# Patient Record
Sex: Male | Born: 2000 | Race: Black or African American | Hispanic: No | Marital: Single | Smoking: Never smoker
Health system: Southern US, Community
[De-identification: ages and names within clinical notes are randomized; demographics above are authoritative.]

## PROBLEM LIST (undated history)

## (undated) DIAGNOSIS — M719 Bursopathy, unspecified: Secondary | ICD-10-CM

---
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
## 2019-12-07 IMAGING — US US ABDOMEN LIMITED
1 series · 14 of 25 positions shown · non-contrast
Comparison: None.

CLINICAL DATA: Acute upper abdominal pain, nausea.

EXAM:
ULTRASOUND ABDOMEN LIMITED RIGHT UPPER QUADRANT

[Series 1: us abdomen limited · 0.24mm/px · 14 of 47 slices shown]
[im 1/47]
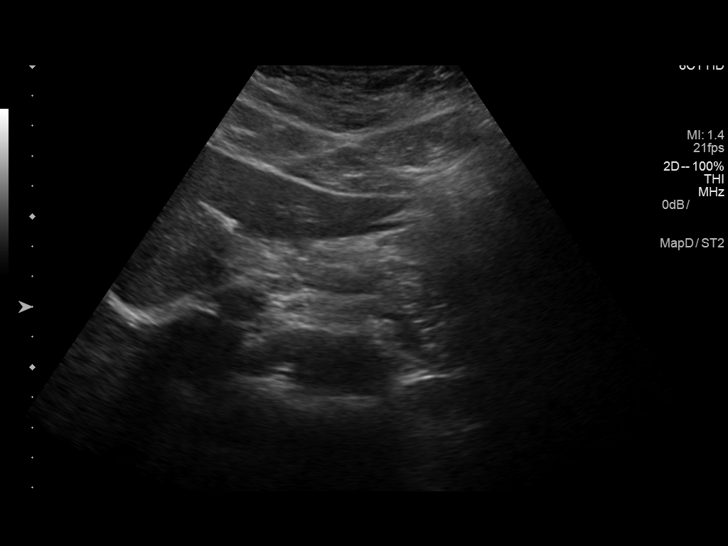
[im 4/47]
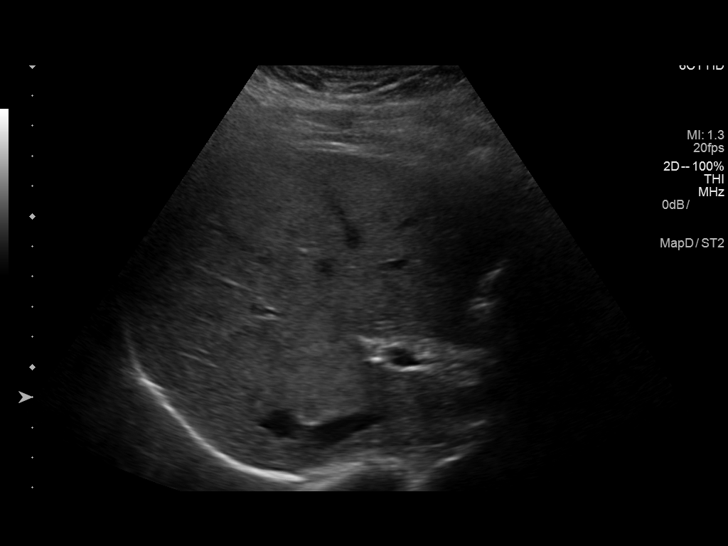
[im 8/47]
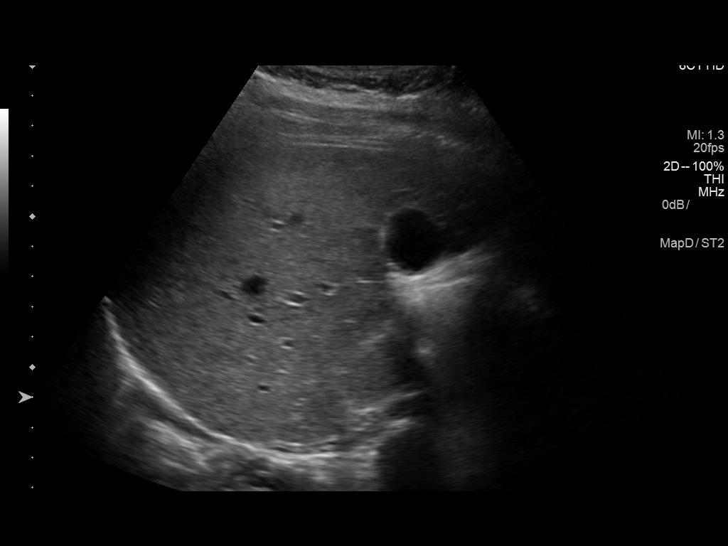
[im 12/47]
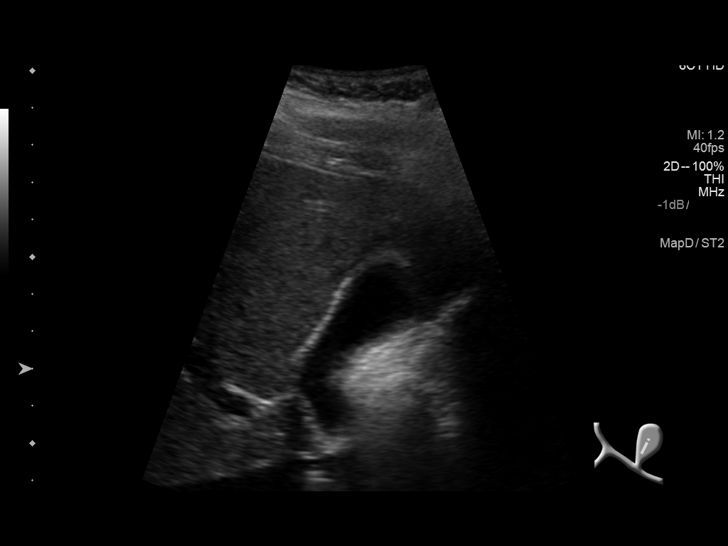
[im 16/47]
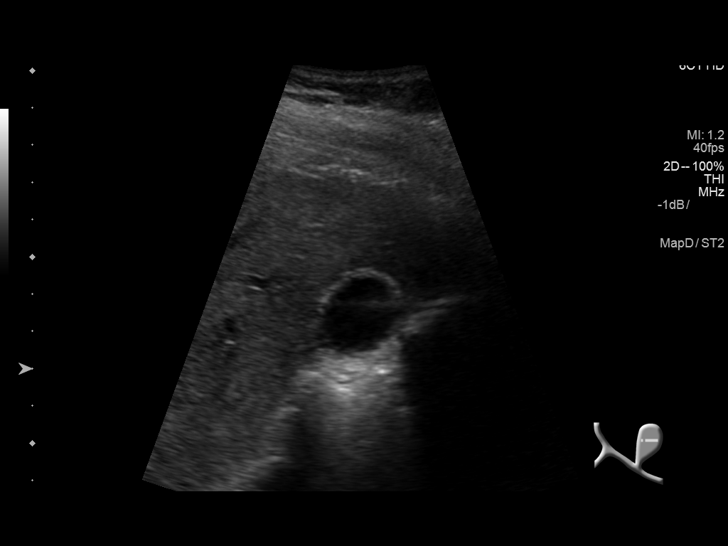
[im 18/47]
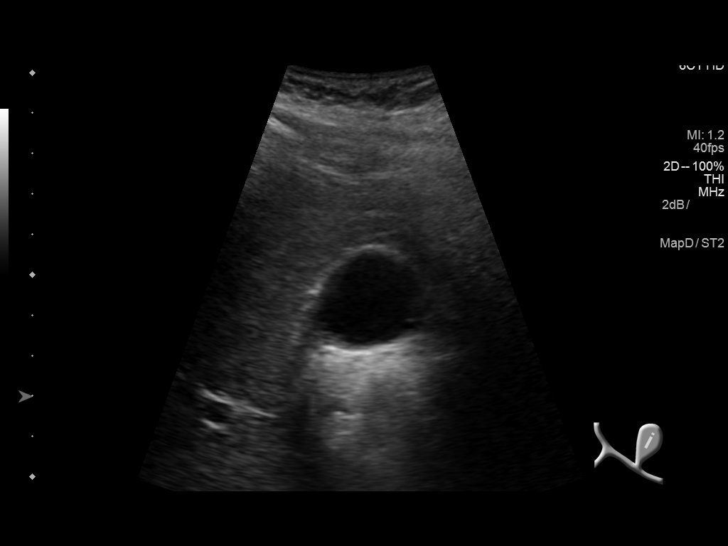
[im 22/47]
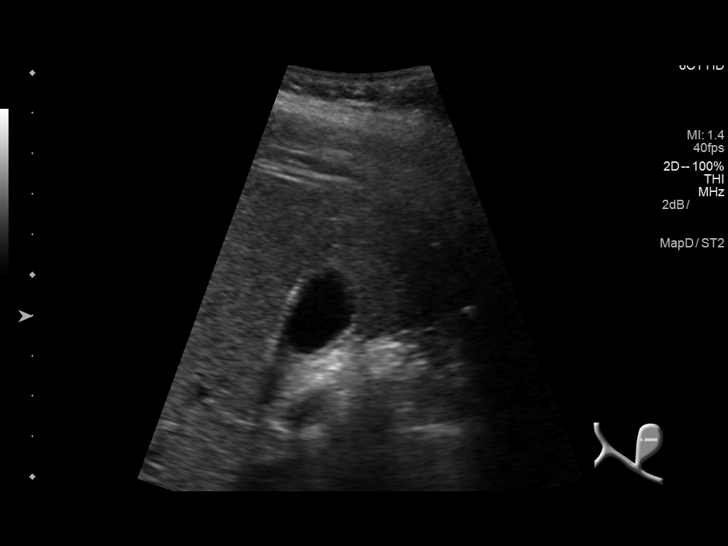
[im 25/47]
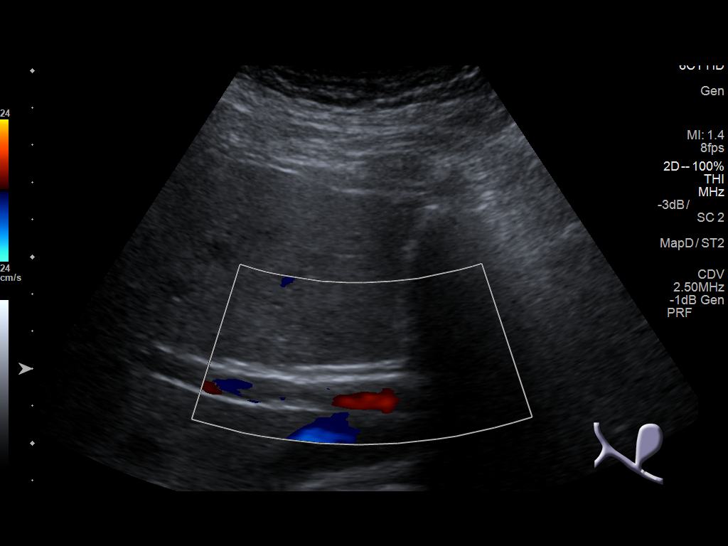
[im 29/47]
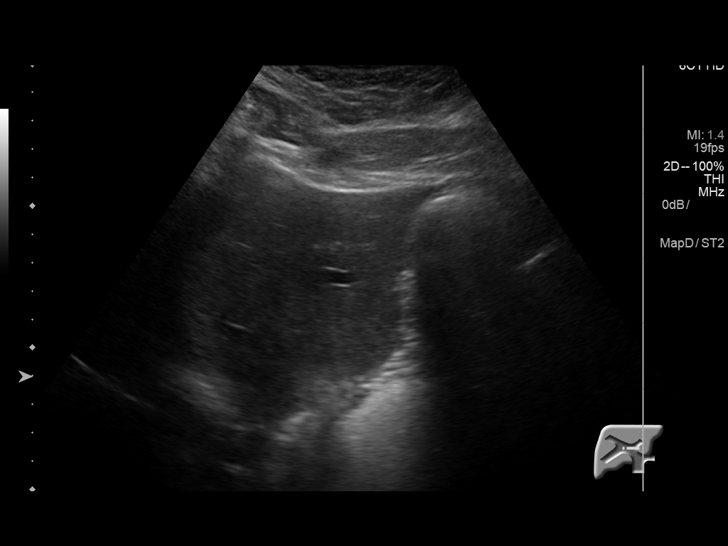
[im 31/47]
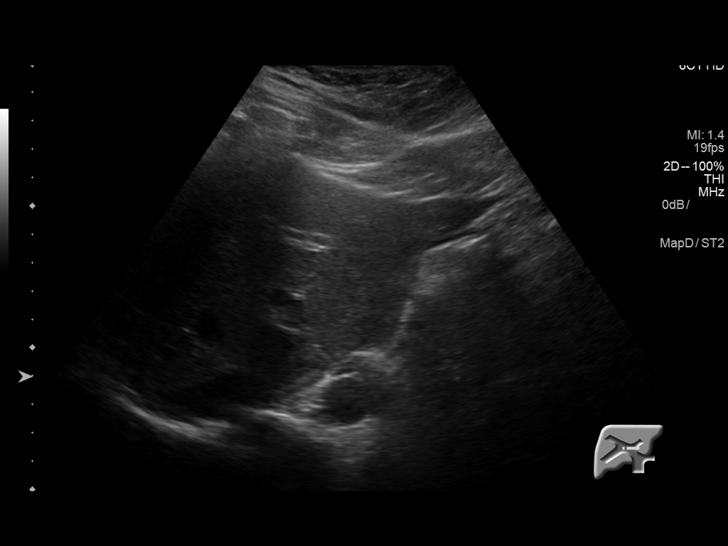
[im 35/47]
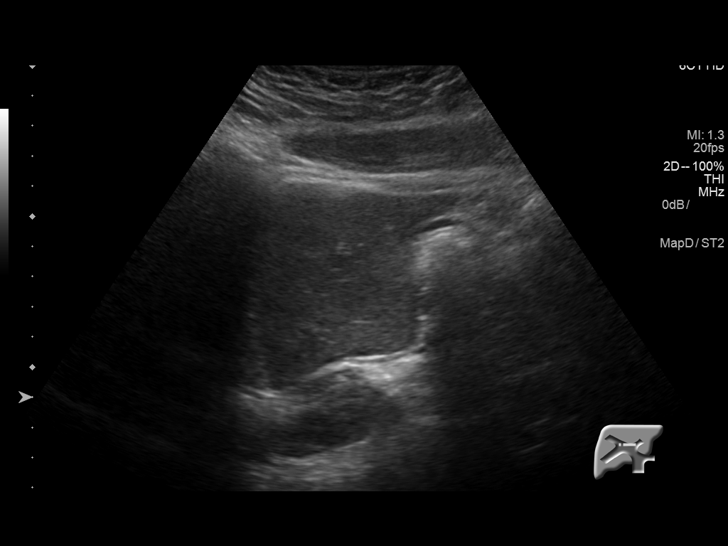
[im 39/47]
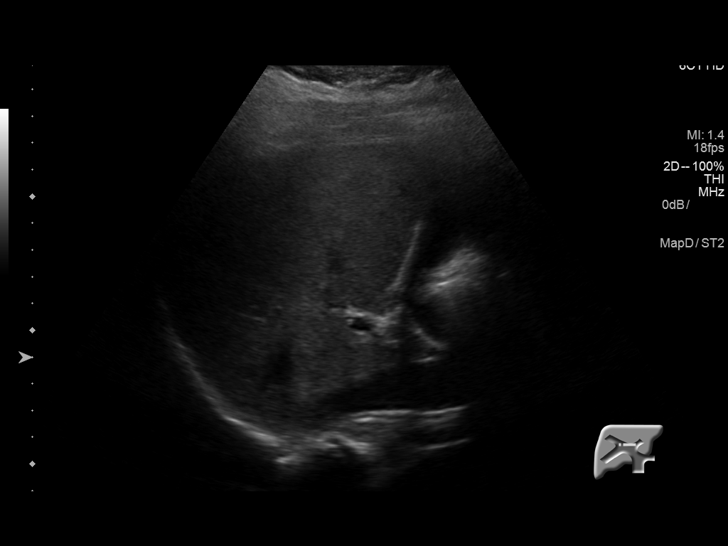
[im 43/47]
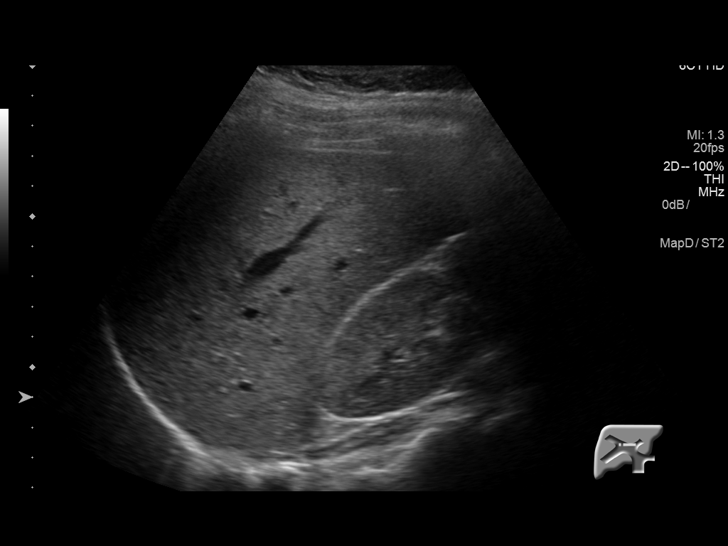
[im 47/47]
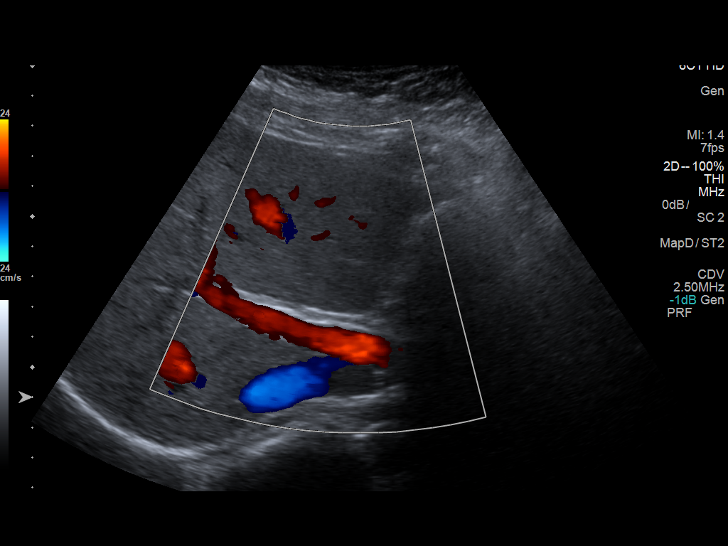

[14 of 25 positions shown; findings below may reference images not displayed]

FINDINGS: Gallbladder:

No gallstones or wall thickening visualized. No sonographic Murphy
sign noted by sonographer.

Common bile duct:

Diameter: 1.9 mm which is within normal limits.

Liver:

No focal lesion identified. Within normal limits in parenchymal
echogenicity. Portal vein is patent on color Doppler imaging with
normal direction of blood flow towards the liver.
IMPRESSION: No abnormality seen in the right upper quadrant of the abdomen.
